# Patient Record
Sex: Male | Born: 1970 | Hispanic: Yes | Marital: Single | Smoking: Current some day smoker
Health system: Southern US, Community
[De-identification: ages and names within clinical notes are randomized; demographics above are authoritative.]

## PROBLEM LIST (undated history)

## (undated) DIAGNOSIS — I10 Essential (primary) hypertension: Secondary | ICD-10-CM

## (undated) DIAGNOSIS — L0291 Cutaneous abscess, unspecified: Secondary | ICD-10-CM

## (undated) DIAGNOSIS — E119 Type 2 diabetes mellitus without complications: Secondary | ICD-10-CM

---

## 2007-04-28 ENCOUNTER — Ambulatory Visit: Payer: Self-pay | Admitting: Gastroenterology

## 2009-02-11 ENCOUNTER — Emergency Department: Payer: Self-pay | Admitting: Emergency Medicine

## 2009-03-10 ENCOUNTER — Ambulatory Visit: Payer: Self-pay | Admitting: Family Medicine

## 2012-05-14 ENCOUNTER — Emergency Department: Payer: Self-pay | Admitting: Emergency Medicine

## 2014-04-01 ENCOUNTER — Ambulatory Visit
Admit: 2014-04-01 | Disposition: A | Discharge status: Home / Self Care | Payer: Self-pay | Attending: Adult Health | Admitting: Adult Health

## 2014-04-09 ENCOUNTER — Ambulatory Visit
Admit: 2014-04-09 | Disposition: A | Discharge status: Home / Self Care | Payer: Self-pay | Attending: Adult Health | Admitting: Adult Health

## 2017-06-10 ENCOUNTER — Other Ambulatory Visit (HOSPITAL_COMMUNITY): Payer: Self-pay | Admitting: Family Medicine

## 2017-06-10 ENCOUNTER — Ambulatory Visit
Admission: RE | Admit: 2017-06-10 | Discharge: 2017-06-10 | Disposition: A | Discharge status: Home / Self Care | Payer: Self-pay | Source: Ambulatory Visit | Attending: Family Medicine | Admitting: Family Medicine

## 2017-06-10 ENCOUNTER — Other Ambulatory Visit
Admission: RE | Admit: 2017-06-10 | Discharge: 2017-06-10 | Disposition: A | Discharge status: Home / Self Care | Payer: Self-pay | Source: Ambulatory Visit | Attending: Family Medicine | Admitting: Family Medicine

## 2017-06-10 DIAGNOSIS — R7611 Nonspecific reaction to tuberculin skin test without active tuberculosis: Secondary | ICD-10-CM | POA: Insufficient documentation

## 2018-02-18 ENCOUNTER — Emergency Department
Admission: EM | Admit: 2018-02-18 | Discharge: 2018-02-18 | Disposition: A | Discharge status: Home / Self Care | Payer: Self-pay | Attending: Emergency Medicine | Admitting: Emergency Medicine

## 2018-02-18 ENCOUNTER — Encounter: Payer: Self-pay | Admitting: Emergency Medicine

## 2018-02-18 ENCOUNTER — Emergency Department: Payer: Self-pay

## 2018-02-18 ENCOUNTER — Other Ambulatory Visit: Payer: Self-pay

## 2018-02-18 DIAGNOSIS — I10 Essential (primary) hypertension: Secondary | ICD-10-CM | POA: Insufficient documentation

## 2018-02-18 DIAGNOSIS — Z7984 Long term (current) use of oral hypoglycemic drugs: Secondary | ICD-10-CM | POA: Insufficient documentation

## 2018-02-18 DIAGNOSIS — E119 Type 2 diabetes mellitus without complications: Secondary | ICD-10-CM | POA: Insufficient documentation

## 2018-02-18 DIAGNOSIS — F17210 Nicotine dependence, cigarettes, uncomplicated: Secondary | ICD-10-CM | POA: Insufficient documentation

## 2018-02-18 DIAGNOSIS — M25461 Effusion, right knee: Secondary | ICD-10-CM | POA: Insufficient documentation

## 2018-02-18 HISTORY — DX: Type 2 diabetes mellitus without complications: E11.9

## 2018-02-18 HISTORY — DX: Essential (primary) hypertension: I10

## 2018-02-18 HISTORY — DX: Cutaneous abscess, unspecified: L02.91

## 2018-02-18 MED ORDER — MELOXICAM 15 MG PO TABS: 15.0000 mg | ORAL_TABLET | Freq: Every day | ORAL | 2 refills | Status: AC

## 2018-02-18 NOTE — ED Provider Notes (Signed)
[facility] Emergency Department Provider Note  ____________________________________________   First MD Initiated Contact with Patient 02/18/18 [phone]     (approximate)  I have reviewed the triage vital signs and the nursing notes.   HISTORY  Chief Complaint Knee Pain    HPI John Leonard is a 48 y.o. male [facility] emergency department complaining of right knee pain.  He states he has had pain for several weeks that has increased the last 3 days.  He denies any known injury.  Patient states that he has diabetes which is controlled with metformin.    Past Medical History:  Diagnosis Date  . Abscess   . Diabetes mellitus without complication (HCC)   . Hypertension     There are no active problems to display for this patient.   History reviewed. No pertinent surgical history.  Prior to Admission medications   Medication Sig Start Date End Date Taking? Authorizing Provider  metFORMIN (GLUCOPHAGE-XR) 750 MG 24 hr tablet Take 750 mg by mouth daily with breakfast.   Yes [provider]  meloxicam (MOBIC) 15 MG tablet Take 1 tablet (15 mg total) by mouth daily. 02/18/18 02/18/19  Faythe Ghee, PA-C    Allergies Patient has no known allergies.  No family history on file.  Social History Social History   Tobacco Use  . Smoking status: Current Some Day Smoker  . Smokeless tobacco: Never Used  Substance Use Topics  . Alcohol use: Yes  . Drug use: Not on file    Review of Systems  Constitutional: No fever/chills Eyes: No visual changes. ENT: No sore throat. Respiratory: Denies cough Genitourinary: Negative for dysuria. Musculoskeletal: Negative for back pain.  Positive for right knee pain Skin: Negative for rash.    ____________________________________________   PHYSICAL EXAM:  VITAL SIGNS: ED Triage Vitals  Enc Vitals Group     BP 02/18/18 0722 (!) 141/79     Pulse Rate 02/18/18 0722 73     Resp 02/18/18 0722 19    Temp 02/18/18 0722 98.7 F (37.1 C)     Temp Source 02/18/18 0722 Oral     SpO2 02/18/18 0722 98 %     Weight 02/18/18 0722 168 lb (76.2 kg)     Height 02/18/18 0722 5\' 6"  (1.676 m)     Head Circumference --      Peak Flow --      Pain Score 02/18/18 0725 8     Pain Loc --      Pain Edu? --      Excl. in GC? --     Constitutional: Alert and oriented. Well appearing and in no acute distress. Eyes: Conjunctivae are normal.  Head: Atraumatic. Nose: No congestion/rhinnorhea. Mouth/Throat: Mucous membranes are moist.   Neck:  supple no lymphadenopathy noted Cardiovascular: Normal rate, regular rhythm. Heart sounds are normal Respiratory: Normal respiratory effort.  No retractions, lungs c t a  GU: deferred Musculoskeletal: FROM all extremities, warm and well perfused, the right knee is tender at the joint line, no swelling is noted, no crepitus is noted, neurovascular is intact Neurologic:  Normal speech and language.  Skin:  Skin is warm, dry and intact. No rash noted. Psychiatric: Mood and affect are normal. Speech and behavior are normal.  ____________________________________________   LABS (all labs ordered are listed, but only abnormal results are displayed)  Labs Reviewed - No data to display ____________________________________________   ____________________________________________  RADIOLOGY  X-ray of the right knee shows a  very small joint effusion  ____________________________________________   PROCEDURES  Procedure(s) performed: Knee immobilizer  Procedures    ____________________________________________   INITIAL IMPRESSION / ASSESSMENT AND PLAN / ED COURSE  Pertinent labs & imaging results that were available during my care of the patient were reviewed by me and considered in my medical decision making (see chart for details).   Patient is 48 year old diabetic male presents emergency department complaint of right knee pain.  Physical exam shows  that the knee is tender in the joint line with minimal swelling  X-ray of the right knee shows a small joint effusion.  Explained the findings to the patient.  Is placed in a knee immobilizer.  Explained to him that would not want to drain this effusion at this time.  He wants to try conservative measures due to him being diabetic.  He can follow-up with orthopedics if not improving in 1 week and see if they will either draw the fluid off the knee or put a shot of cortisone in the knee.  States he understands and will comply.  Is given prescription meloxicam.  Is discharged stable condition.     As part of my medical decision making, I reviewed the following data within the electronic MEDICAL RECORD NUMBER Nursing notes reviewed and incorporated, Old chart reviewed, Radiograph reviewed x-ray of the right knee is negative, Notes from prior ED visits and [place] Controlled Substance Database  ____________________________________________   FINAL CLINICAL IMPRESSION(S) / ED DIAGNOSES  Final diagnoses:  Effusion of right knee      NEW MEDICATIONS STARTED DURING THIS VISIT:  New Prescriptions   MELOXICAM (MOBIC) 15 MG TABLET    Take 1 tablet (15 mg total) by mouth daily.     Note:  This document was prepared using Dragon voice recognition software and may include unintentional dictation errors.    Faythe GheeFisher, Susan W, PA-C 02/18/18 1045    Rockne MenghiniNorman, Anne-Caroline, MD 02/18/18 1248

## 2018-02-18 NOTE — Discharge Instructions (Addendum)
Follow-up with orthopedics.  Please call for appointment.  Apply ice to the right knee 3 times daily.  Take medication as prescribed.

## 2018-02-18 NOTE — ED Notes (Signed)
See triage note  Presents with right knee pain  States he has had pain to knee in past but over the past 3 days pain has increased  States he has been up on ladder a lot working   Ambulates with slight limp

## 2018-02-18 NOTE — ED Triage Notes (Signed)
Says right knee pain for quit a while, but for 3 days it has gotten worse.  He does not rmember injuring it.

## 2018-02-18 NOTE — ED Notes (Signed)
Pt was stable and ambulatory with a knee immobilizer in place at the time of discharge.

## 2018-03-30 ENCOUNTER — Emergency Department: Payer: Self-pay

## 2018-03-30 ENCOUNTER — Encounter: Payer: Self-pay | Admitting: Emergency Medicine

## 2018-03-30 ENCOUNTER — Other Ambulatory Visit: Payer: Self-pay

## 2018-03-30 ENCOUNTER — Emergency Department
Admission: EM | Admit: 2018-03-30 | Discharge: 2018-03-30 | Disposition: A | Discharge status: Home / Self Care | Payer: Self-pay | Attending: Emergency Medicine | Admitting: Emergency Medicine

## 2018-03-30 DIAGNOSIS — R05 Cough: Secondary | ICD-10-CM

## 2018-03-30 DIAGNOSIS — Z79899 Other long term (current) drug therapy: Secondary | ICD-10-CM | POA: Insufficient documentation

## 2018-03-30 DIAGNOSIS — I10 Essential (primary) hypertension: Secondary | ICD-10-CM | POA: Insufficient documentation

## 2018-03-30 DIAGNOSIS — R059 Cough, unspecified: Secondary | ICD-10-CM

## 2018-03-30 DIAGNOSIS — E119 Type 2 diabetes mellitus without complications: Secondary | ICD-10-CM | POA: Insufficient documentation

## 2018-03-30 DIAGNOSIS — R1013 Epigastric pain: Secondary | ICD-10-CM

## 2018-03-30 DIAGNOSIS — K2900 Acute gastritis without bleeding: Secondary | ICD-10-CM

## 2018-03-30 DIAGNOSIS — F17210 Nicotine dependence, cigarettes, uncomplicated: Secondary | ICD-10-CM | POA: Insufficient documentation

## 2018-03-30 LAB — LIPASE, BLOOD: Lipase: 39 U/L (ref 11–51)

## 2018-03-30 LAB — COMPREHENSIVE METABOLIC PANEL
ALT: 46 U/L — ABNORMAL HIGH (ref 0–44)
AST: 35 U/L (ref 15–41)
Albumin: 4.6 g/dL (ref 3.5–5.0)
Alkaline Phosphatase: 101 U/L (ref 38–126)
Anion gap: 8 (ref 5–15)
BILIRUBIN TOTAL: 0.6 mg/dL (ref 0.3–1.2)
BUN: 18 mg/dL (ref 6–20)
CO2: 26 mmol/L (ref 22–32)
CREATININE: 0.82 mg/dL (ref 0.61–1.24)
Calcium: 9 mg/dL (ref 8.9–10.3)
Chloride: 103 mmol/L (ref 98–111)
GFR calc non Af Amer: >60 mL/min (ref >60)
Glucose, Bld: 209 mg/dL — ABNORMAL HIGH (ref 70–99)
Potassium: 4.1 mmol/L (ref 3.5–5.1)
Sodium: 137 mmol/L (ref 135–145)
Total Protein: 7.8 g/dL (ref 6.5–8.1)

## 2018-03-30 LAB — CBC
HCT: 44.3 % (ref 39.0–52.0)
Hemoglobin: 14.8 g/dL (ref 13.0–17.0)
MCH: 30.3 pg (ref 26.0–34.0)
MCHC: 33.4 g/dL (ref 30.0–36.0)
MCV: 90.6 fL (ref 80.0–100.0)
PLATELETS: 167 10*3/uL (ref 150–400)
RBC: 4.89 MIL/uL (ref 4.22–5.81)
RDW: 12.5 % (ref 11.5–15.5)
WBC: 6.6 10*3/uL (ref 4.0–10.5)
nRBC: 0 % (ref 0.0–0.2)

## 2018-03-30 LAB — TROPONIN I: Troponin I: <0.03 ng/mL (ref <0.03)

## 2018-03-30 MED ORDER — SODIUM CHLORIDE 0.9% FLUSH: 3.0000 mL | Freq: Once | INTRAVENOUS | Status: DC

## 2018-03-30 MED ORDER — LIDOCAINE VISCOUS HCL 2 % MT SOLN
15.0000 mL | Freq: Once | OROMUCOSAL | Status: AC
  Administered 2018-03-30: 15 mL via ORAL
  Filled 2018-03-30: qty 15

## 2018-03-30 MED ORDER — ALUM & MAG HYDROXIDE-SIMETH 200-200-20 MG/5ML PO SUSP
30.0000 mL | Freq: Once | ORAL | Status: AC
  Administered 2018-03-30: 30 mL via ORAL
  Filled 2018-03-30: qty 30

## 2018-03-30 MED ORDER — FAMOTIDINE 40 MG PO TABS: 40.0000 mg | ORAL_TABLET | Freq: Every day | ORAL | 0 refills | Status: AC

## 2018-03-30 MED ORDER — FAMOTIDINE 20 MG PO TABS
20.0000 mg | ORAL_TABLET | Freq: Once | ORAL | Status: AC
  Administered 2018-03-30: 20 mg via ORAL
  Filled 2018-03-30: qty 1

## 2018-03-30 NOTE — ED Triage Notes (Signed)
Pt via pov from home with epigastric pain x 3 days as well as fever and cough. Pt ambulatory with no distress noted. Pt states he has had fever and runny nose with productive cough (yellow) x 3 days. Pt alert & oriented with NAD noted.

## 2018-03-30 NOTE — Discharge Instructions (Addendum)
Suspenda todos los NSAIDs, incluido el naproxeno, y tome 1000 mg de tylenol 3 veces al da segn sea necesario para [employer]. Tome pepcid una vez por noche durante las prximas 4 semanas para ayudar a que su estmago sane. Haga un seguimiento con su mdico en 2 das. Regrese a la sala de emergencias si tiene dolor abdominal nuevo o que empeora, si vomita sangre o est negro, si las heces son sanguinolentas o negras, falta de [place].   Stop all NSAIDs including naproxen and take tylenol 1000mg  3 times a day as needed for pain. Take pepcid once every night for the next 4 weeks to help your stomach heal. Follow up with your doctor in 2 days. Return to the ER for new or worsening abdominal pain, if you vomit blood or black, if your stool is bloody or black, shortness of breath, or fever.

## 2018-03-30 NOTE — ED Triage Notes (Signed)
FIRST NURSE NOTE-here for abdominal pain, reported fever, and cough. Ambulatory. NAD. Unlabored.

## 2018-03-30 NOTE — ED Provider Notes (Signed)
[facility] Emergency Department Provider Note  ____________________________________________  Time seen: Approximately 9:11 AM  I have reviewed the triage vital signs and the nursing notes.   HISTORY  Chief Complaint Abdominal Pain   HPI John Leonard is a 48 y.o. male with a history of diabetes and hypertension who presents for evaluation of cough, fever and abdominal pain.  Patient reports that his symptoms started 3 days ago.  Has had a dry cough with no chest pain or shortness of breath.  Has had subjective fevers at nighttime.  Last Motrin was yesterday evening.  No antipyretics this morning.  Patient also is complaining of intermittent sharp epigastric abdominal pain lasting seconds at a time.  No prior abdominal surgeries.  No vomiting or diarrhea. Patient has tried over-the-counter reflux medication this morning with no significant relief.  No known sick contact exposures.  No recent travel to endemic areas Covid 19. Patient reports taking a lot of NSAIDs recently for knee pain. No melena.  Past Medical History:  Diagnosis Date  . Abscess   . Diabetes mellitus without complication (HCC)   . Hypertension     Prior to Admission medications   Medication Sig Start Date End Date Taking? Authorizing Provider  famotidine (PEPCID) 40 MG tablet Take 1 tablet (40 mg total) by mouth at bedtime for 30 days. 03/30/18 04/29/18  Nita Sickle, MD  meloxicam (MOBIC) 15 MG tablet Take 1 tablet (15 mg total) by mouth daily. 02/18/18 02/18/19  Sherrie Mustache, Roselyn Bering, PA-C  metFORMIN (GLUCOPHAGE-XR) 750 MG 24 hr tablet Take 750 mg by mouth daily with breakfast.    [provider]    Allergies Patient has no known allergies.  History reviewed. No pertinent family history.  Social History Social History   Tobacco Use  . Smoking status: Current Some Day Smoker  . Smokeless tobacco: Never Used  Substance Use Topics  . Alcohol use: Yes    Comment:  sometimes  . Drug use: Not Currently    Review of Systems  Constitutional: + subjective fever. Eyes: Negative for visual changes. ENT: Negative for sore throat. Neck: No neck pain  Cardiovascular: Negative for chest pain. Respiratory: Negative for shortness of breath. + cough Gastrointestinal: Negative for abdominal pain, no vomiting or diarrhea. Genitourinary: Negative for dysuria. Musculoskeletal: Negative for back pain. Skin: Negative for rash. Neurological: Negative for headaches, weakness or numbness. Psych: No SI or HI  ____________________________________________   PHYSICAL EXAM:  VITAL SIGNS: ED Triage Vitals  Enc Vitals Group     BP 03/30/18 0850 (!) 155/91     Pulse Rate 03/30/18 0850 66     Resp 03/30/18 0850 18     Temp 03/30/18 0850 98.1 F (36.7 C)     Temp Source 03/30/18 0850 Oral     SpO2 03/30/18 0850 100 %     Weight 03/30/18 0852 170 lb (77.1 kg)     Height 03/30/18 0852 5\' 6"  (1.676 m)     Head Circumference --      Peak Flow --      Pain Score 03/30/18 0851 8     Pain Loc --      Pain Edu? --      Excl. in GC? --     Constitutional: Alert and oriented. Well appearing and in no apparent distress. HEENT:      Head: Normocephalic and atraumatic.         Eyes: Conjunctivae are normal. Sclera is non-icteric.  Mouth/Throat: Mucous membranes are moist.       Neck: Supple with no signs of meningismus. Cardiovascular: Regular rate and rhythm. No murmurs, gallops, or rubs. 2+ symmetrical distal pulses are present in all extremities. No JVD. Respiratory: Normal respiratory effort. Lungs are clear to auscultation bilaterally. No wheezes, crackles, or rhonchi.  Gastrointestinal: Soft, RUQ and epigastric tenderness, and non distended with positive bowel sounds. No rebound or guarding. Musculoskeletal: Nontender with normal range of motion in all extremities. No edema, cyanosis, or erythema of extremities. Neurologic: Normal speech and language. Face  is symmetric. Moving all extremities. No gross focal neurologic deficits are appreciated. Skin: Skin is warm, dry and intact. No rash noted. Psychiatric: Mood and affect are normal. Speech and behavior are normal.  ____________________________________________   LABS (all labs ordered are listed, but only abnormal results are displayed)  Labs Reviewed  COMPREHENSIVE METABOLIC PANEL - Abnormal; Notable for the following components:      Result Value   Glucose, Bld 209 (*)    ALT 46 (*)    All other components within normal limits  LIPASE, BLOOD  CBC  TROPONIN I  URINALYSIS, COMPLETE (UACMP) WITH MICROSCOPIC   ____________________________________________  EKG  ED ECG REPORT I, Nita Sickle, the attending physician, personally viewed and interpreted this ECG.  Normal sinus rhythm, rate of 71, normal intervals, normal axis, no ST elevations or depressions, T wave flattening in inferior and lateral leads.  Unchanged from prior from 2014 ____________________________________________  RADIOLOGY  I have personally reviewed the images performed during this visit and I agree with the Radiologist's read.   Interpretation by Radiologist:  Dg Chest Portable 1 View  Result Date: 03/30/2018 CLINICAL DATA:  Epigastric pain 3 days with fever and productive cough. EXAM: PORTABLE CHEST 1 VIEW COMPARISON:  06/10/2017 FINDINGS: Lungs are clear. Cardiomediastinal silhouette and remainder of the exam is unchanged. IMPRESSION: No active disease. Electronically Signed   By: Elberta Fortis M.D.   On: 03/30/2018 09:21   US Abdomen Limited Ruq  Result Date: 03/30/2018 CLINICAL DATA:  Epigastric pain 3 days.  Cough and fever. EXAM: ULTRASOUND ABDOMEN LIMITED RIGHT UPPER QUADRANT COMPARISON:  Abdominal ultrasound 03/10/2009. FINDINGS: Gallbladder: No gallstones or wall thickening visualized. No sonographic Murphy sign noted by sonographer. Common bile duct: Diameter: 1.6 mm. Liver: Increased  parenchymal echogenicity without focal mass. Portal vein is patent on color Doppler imaging with normal direction of blood flow towards the liver. IMPRESSION: No acute findings. Hepatic steatosis without focal mass. Electronically Signed   By: Elberta Fortis M.D.   On: 03/30/2018 11:10      ____________________________________________   PROCEDURES  Procedure(s) performed: None Procedures Critical Care performed:  None ____________________________________________   INITIAL IMPRESSION / ASSESSMENT AND PLAN / ED COURSE  48 y.o. male with a history of diabetes and hypertension who presents for evaluation of cough, fever and epigastric abdominal pain x 3 days.  Patient is well-appearing in no distress with normal vital signs, abdomen is soft with RUQ and epigastric tenderness with no rebound or guarding.  Differential diagnosis including GERD versus PUD versus pancreatitis versus gallbladder versus gastritis versus pneumonia versus flu versus viral illness.  Will give Pepcid and a GI cocktail.  Will get chest x-ray and labs.    _________________________ 11:31 AM on 03/30/2018 -----------------------------------------  Labs with no acute findings.  X-ray with no acute findings.  Right upper quadrant ultrasound showed no evidence of cholelithiasis or cholecystitis.  Chest x-ray negative for pneumonia.  Patient's presentation most likely  concerning for gastritis in the setting of heavy NSAID use recently plus a viral illness causing cough.  Remains afebrile in the emergency room.  Discussed return precautions.  Patient will be discharged home on Pepcid.  Recommended switching to Tylenol for aches and pains instead of NSAIDs.   As part of my medical decision making, I reviewed the following data within the electronic MEDICAL RECORD NUMBER Nursing notes reviewed and incorporated, Labs reviewed , EKG interpreted , Old EKG reviewed, Old chart reviewed, Radiograph reviewed , Notes from prior ED visits and [place]  Controlled Substance Database    Pertinent labs & imaging results that were available during my care of the patient were reviewed by me and considered in my medical decision making (see chart for details).    ____________________________________________   FINAL CLINICAL IMPRESSION(S) / ED DIAGNOSES  Final diagnoses:  Epigastric abdominal pain  Acute gastritis without hemorrhage, unspecified gastritis type  Cough      NEW MEDICATIONS STARTED DURING THIS VISIT:  ED Discharge Orders         Ordered    famotidine (PEPCID) 40 MG tablet  Daily at bedtime     03/30/18 1127           Note:  This document was prepared using Dragon voice recognition software and may include unintentional dictation errors.    Don Perking, [place], MD 03/30/18 [phone]

## 2019-07-18 ENCOUNTER — Ambulatory Visit: Payer: Self-pay | Attending: Internal Medicine

## 2019-07-18 DIAGNOSIS — Z23 Encounter for immunization: Secondary | ICD-10-CM

## 2019-07-18 NOTE — Progress Notes (Signed)
   Covid-19 Vaccination Clinic  Name:  Davian Hanshaw    MRN: [id number] DOB: [birth year]  07/18/2019  Mr. Kmari Halter was observed post Covid-19 immunization for 15 minutes without incident. He was provided with Vaccine Information Sheet and instruction to access the V-Safe system.   Mr. Shyhiem Beeney was instructed to call 911 with any severe reactions post vaccine: Marland Kitchen Difficulty breathing  . Swelling of face and throat  . A fast heartbeat  . A bad rash all over body  . Dizziness and weakness   Immunizations Administered    Name Date Dose VIS Date Route   Pfizer COVID-19 Vaccine 07/18/2019 11:03 AM 0.3 mL 03/04/2018 Intramuscular   Manufacturer: [employer], [employer]   Lot: [id number]   NDC: [address]-6

## 2019-11-30 ENCOUNTER — Emergency Department (HOSPITAL_COMMUNITY)
Admission: EM | Admit: 2019-11-30 | Discharge: 2019-12-01 | Disposition: A | Discharge status: Home / Self Care | Payer: No Typology Code available for payment source | Attending: Emergency Medicine | Admitting: Emergency Medicine

## 2019-11-30 ENCOUNTER — Emergency Department (HOSPITAL_COMMUNITY): Payer: No Typology Code available for payment source

## 2019-11-30 ENCOUNTER — Encounter (HOSPITAL_COMMUNITY): Payer: Self-pay | Admitting: Pediatrics

## 2019-11-30 ENCOUNTER — Other Ambulatory Visit: Payer: Self-pay

## 2019-11-30 DIAGNOSIS — S6721XA Crushing injury of right hand, initial encounter: Secondary | ICD-10-CM | POA: Diagnosis not present

## 2019-11-30 DIAGNOSIS — E119 Type 2 diabetes mellitus without complications: Secondary | ICD-10-CM | POA: Insufficient documentation

## 2019-11-30 DIAGNOSIS — I10 Essential (primary) hypertension: Secondary | ICD-10-CM | POA: Insufficient documentation

## 2019-11-30 DIAGNOSIS — Z7984 Long term (current) use of oral hypoglycemic drugs: Secondary | ICD-10-CM | POA: Insufficient documentation
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
## 2019-12-01 MED ORDER — CYCLOBENZAPRINE HCL 10 MG PO TABS: 10.0000 mg | ORAL_TABLET | Freq: Two times a day (BID) | ORAL | 0 refills | Status: DC | PRN

## 2019-12-01 MED ORDER — OXYCODONE-ACETAMINOPHEN 5-325 MG PO TABS
2.0000 | ORAL_TABLET | Freq: Once | ORAL | Status: AC
  Administered 2019-12-01: 2 via ORAL
  Filled 2019-12-01: qty 2

## 2019-12-01 MED ORDER — KETOROLAC TROMETHAMINE 60 MG/2ML IM SOLN
30.0000 mg | Freq: Once | INTRAMUSCULAR | Status: AC
  Administered 2019-12-01: 30 mg via INTRAMUSCULAR
  Filled 2019-12-01: qty 2

## 2019-12-01 MED ORDER — MELOXICAM 7.5 MG PO TABS: 7.5000 mg | ORAL_TABLET | Freq: Every day | ORAL | 0 refills | Status: AC
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
## 2019-12-01 NOTE — ED Notes (Signed)
Pt refused DG Shoulder Right. EDP made aware.

## 2019-12-01 NOTE — ED Notes (Signed)
Patient verbalizes understanding of discharge instructions. Opportunity for questioning and answers were provided. Armband removed by staff, pt discharged from ED ambulatory to home.  
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
## 2022-05-26 ENCOUNTER — Emergency Department: Payer: Self-pay

## 2022-05-26 ENCOUNTER — Emergency Department
Admission: EM | Admit: 2022-05-26 | Discharge: 2022-05-26 | Disposition: A | Discharge status: Home / Self Care | Payer: Self-pay | Attending: Emergency Medicine | Admitting: Emergency Medicine

## 2022-05-26 ENCOUNTER — Other Ambulatory Visit: Payer: Self-pay

## 2022-05-26 ENCOUNTER — Encounter: Payer: Self-pay | Admitting: Emergency Medicine

## 2022-05-26 DIAGNOSIS — Y990 Civilian activity done for income or pay: Secondary | ICD-10-CM | POA: Insufficient documentation

## 2022-05-26 DIAGNOSIS — S29019A Strain of muscle and tendon of unspecified wall of thorax, initial encounter: Secondary | ICD-10-CM

## 2022-05-26 DIAGNOSIS — I10 Essential (primary) hypertension: Secondary | ICD-10-CM | POA: Diagnosis not present

## 2022-05-26 DIAGNOSIS — E119 Type 2 diabetes mellitus without complications: Secondary | ICD-10-CM | POA: Diagnosis not present

## 2022-05-26 DIAGNOSIS — M546 Pain in thoracic spine: Secondary | ICD-10-CM | POA: Diagnosis present

## 2022-05-26 DIAGNOSIS — X58XXXA Exposure to other specified factors, initial encounter: Secondary | ICD-10-CM | POA: Insufficient documentation

## 2022-05-26 DIAGNOSIS — S29012A Strain of muscle and tendon of back wall of thorax, initial encounter: Secondary | ICD-10-CM | POA: Insufficient documentation

## 2022-05-26 DIAGNOSIS — Z79899 Other long term (current) drug therapy: Secondary | ICD-10-CM | POA: Diagnosis not present

## 2022-05-26 LAB — BASIC METABOLIC PANEL
Anion gap: 9 (ref 5–15)
BUN: 10 mg/dL (ref 6–20)
CO2: 25 mmol/L (ref 22–32)
Calcium: 9.3 mg/dL (ref 8.9–10.3)
Chloride: 101 mmol/L (ref 98–111)
Creatinine, Ser: 0.67 mg/dL (ref 0.61–1.24)
GFR, Estimated: >60 mL/min (ref >60)
Glucose, Bld: 363 mg/dL — ABNORMAL HIGH (ref 70–99)
Potassium: 3.8 mmol/L (ref 3.5–5.1)
Sodium: 135 mmol/L (ref 135–145)

## 2022-05-26 LAB — CBC
HCT: 47.9 % (ref 39.0–52.0)
Hemoglobin: 16.7 g/dL (ref 13.0–17.0)
MCH: 29.3 pg (ref 26.0–34.0)
MCHC: 34.9 g/dL (ref 30.0–36.0)
MCV: 84 fL (ref 80.0–100.0)
Platelets: 211 10*3/uL (ref 150–400)
RBC: 5.7 MIL/uL (ref 4.22–5.81)
RDW: 11.8 % (ref 11.5–15.5)
WBC: 5.8 10*3/uL (ref 4.0–10.5)
nRBC: 0 % (ref 0.0–0.2)

## 2022-05-26 LAB — CK: Total CK: 94 U/L (ref 49–397)

## 2022-05-26 MED ORDER — IOHEXOL 350 MG/ML SOLN
80.0000 mL | Freq: Once | INTRAVENOUS | Status: AC | PRN
  Administered 2022-05-26: 80 mL via INTRAVENOUS

## 2022-05-26 MED ORDER — CYCLOBENZAPRINE HCL 7.5 MG PO TABS: 7.5000 mg | ORAL_TABLET | Freq: Every evening | ORAL | 0 refills | Status: AC | PRN

## 2022-05-26 MED ORDER — KETOROLAC TROMETHAMINE 15 MG/ML IJ SOLN
15.0000 mg | Freq: Once | INTRAMUSCULAR | Status: AC
  Administered 2022-05-26: 15 mg via INTRAVENOUS
  Filled 2022-05-26: qty 1

## 2022-05-26 MED ORDER — AMLODIPINE BESYLATE 2.5 MG PO TABS: 2.5000 mg | ORAL_TABLET | Freq: Every day | ORAL | 2 refills | Status: AC

## 2022-05-26 MED ORDER — IOHEXOL 300 MG/ML  SOLN: 100.0000 mL | Freq: Once | INTRAMUSCULAR | Status: DC | PRN

## 2022-05-26 NOTE — ED Provider Notes (Signed)
[facility] Provider Note    Event Date/Time   First MD Initiated Contact with Patient 05/26/22 [phone]     (approximate)   History   Back Pain   Spanish video interpreter utilized throughout  HPI  John Leonard is a 52 y.o. male has a history of diabetes and hypertension  He works in [employer].  He does not recall any 1 specific injury.  But about 8 days ago he started having a fairly severe pain in his upper back sore between his shoulder blades and a little bit more in the right upper back area.  He reports severe and has become worse.  There is no rashes no swelling no numbness or weakness with it no chest pain.  However does report a severe pain in his upper back.  It is worsened by movement and use of the right arm and right shoulder, but he denies any injury.  He is still able to use the arm and shoulder without difficulty but has lots of pain along the upper mid back  Reports he takes medication for diabetes, but does not take any medicine for high blood pressure.  He does have high blood pressure documented in his clinical history, but reports to myself and interpreter that he is diabetic and does not have issue with high blood pressure     Physical Exam   Triage Vital Signs: ED Triage Vitals  Enc Vitals Group     BP 05/26/22 0733 (!) 176/94     Pulse Rate 05/26/22 0733 73     Resp 05/26/22 0733 18     Temp 05/26/22 0733 98.6 F (37 C)     Temp Source 05/26/22 0733 Oral     SpO2 05/26/22 0733 97 %     Weight 05/26/22 0737 160 lb (72.6 kg)     Height 05/26/22 0737 5\' 6"  (1.676 m)     Head Circumference --      Peak Flow --      Pain Score --      Pain Loc --      Pain Edu? --      Excl. in GC? --     Most recent vital signs: Vitals:   05/26/22 0733  BP: (!) 176/94  Pulse: 73  Resp: 18  Temp: 98.6 F (37 C)  SpO2: 97%     General: Awake, sitting up, appears in pain, reports he cannot seem to find a position that helps  his pain. CV:  Good peripheral perfusion.  Normal tones and rate Resp:  Normal effort.  Clear bilateral, normal work of breathing Abd:  No distention.  Soft nontender nondistended Other:    RIGHT Right upper extremity demonstrates normal strength, good use of all muscles. No edema bruising or contusions of the right shoulder/upper arm, right elbow, right forearm / hand. Full range of motion of the right right upper extremity without pain. No evidence of trauma. Strong radial pulse. Intact median/ulnar/radial neuro-muscular exam.  LEFT Left upper extremity demonstrates normal strength, good use of all muscles. No edema bruising or contusions of the left shoulder/upper arm, left elbow, left forearm / hand. Full range of motion of the left  upper extremity without pain. No evidence of trauma. Strong radial pulse. Intact median/ulnar/radial neuro-muscular exam.  Patient does report tenderness along the right paraspinous thoracic and right trapezius.  However, it is somewhat hard to reproduce his pain he reports it feels like a deep-seated muscle pain, it  is worsened somewhat by use of the right arm   ED Results / Procedures / Treatments   Labs (all labs ordered are listed, but only abnormal results are displayed) Labs Reviewed  BASIC METABOLIC PANEL - Abnormal; Notable for the following components:      Result Value   Glucose, Bld 363 (*)    All other components within normal limits  CBC  CK     EKG  Interpreted by me at 845 heart rate 70 QRS 90 QTc 440 Normal sinus rhythm no evidence of ischemia   RADIOLOGY  CT Angio Chest Aorta W and/or Wo Contrast  Result Date: 05/26/2022 CLINICAL DATA:  Acute aortic syndrome (AAS) suspected upper to R mid back pain, hypertension, exclude dissection EXAM: CT ANGIOGRAPHY CHEST WITH CONTRAST TECHNIQUE: Multidetector CT imaging of the chest was performed using the standard protocol during bolus administration of intravenous contrast. Multiplanar CT  image reconstructions and MIPs were obtained to evaluate the vascular anatomy. RADIATION DOSE REDUCTION: This exam was performed according to the departmental dose-optimization program which includes automated exposure control, adjustment of the mA and/or kV according to patient size and/or use of iterative reconstruction technique. CONTRAST:  80mL OMNIPAQUE IOHEXOL 350 MG/ML SOLN COMPARISON:  None Available. FINDINGS: Cardiovascular: SVC patent. Heart size normal. No pericardial effusion. Satisfactory opacification of pulmonary arteries noted, and there is no evidence of pulmonary emboli. Adequate contrast opacification of the thoracic aorta with no evidence of dissection, aneurysm, or stenosis. There is classic 3-vessel brachiocephalic arch anatomy without proximal stenosis. No significant atheromatous change. Mediastinum/Nodes: No mediastinal hematoma, mass, or adenopathy. Lungs/Pleura: No pleural effusion. No pneumothorax. Minimal dependent atelectasis at the lung bases. Upper Abdomen: No acute findings. Musculoskeletal: Vertebral endplate spurring at multiple levels in the mid and lower thoracic spine. No acute findings. Review of the MIP images confirms the above findings. IMPRESSION: Negative for acute PE or thoracic aortic dissection. Electronically Signed   By: Corlis Leak M.D.   On: 05/26/2022 09:38    I personally interpreted the patient's thoracic x-ray, no acute bony abnormality noted   PROCEDURES:  Critical Care performed: No  Procedures   MEDICATIONS ORDERED IN ED: Medications  ketorolac (TORADOL) 15 MG/ML injection 15 mg (15 mg Intravenous Given 05/26/22 0840)  iohexol (OMNIPAQUE) 350 MG/ML injection 80 mL (80 mLs Intravenous Contrast Given 05/26/22 0925)     IMPRESSION / MDM / ASSESSMENT AND PLAN / ED COURSE  I reviewed the triage vital signs and the nursing notes.                              Differential diagnosis includes, but is not limited to, musculoskeletal causation,  muscle strain, muscle spasm, tendinitis, overuse injury.  However also of concern after discussing with the patient is reports this pain has been severe for about 8 days he does have significant hypertension in the emergency department, and difficult to exclude intrathoracic cause such as dissection given this clinical context.  I feel it is low likelihood to represent dissection, but given the nature of his pain discussed with the patient and he is amenable to proceed with obtaining labs and CT imaging to further evaluate for potential referred pain from intrathoracic cause.  No associated symptoms of ACS.  He is nontoxic well-appearing.  Amenable and agreeable to receiving Toradol  Patient's presentation is most consistent with acute complicated illness / injury requiring diagnostic workup.   ----------------------------------------- 10:07 AM on 05/26/2022 -----------------------------------------  Reviewed workup and recommended treatment with the patient via interpreter.  He is understanding very agreeable.  Informed him not to drive within 8 hours of using cyclobenzaprine.  He will utilize it prior to going to bed and not while driving or working.  Return precautions and treatment recommendations and follow-up discussed with the patient who is agreeable with the plan.  Discussed blood pressure with him.  He is not currently taking a blood pressure medication.  Will start him on a low-dose of amlodipine for uncontrolled hypertension.  I did check his blood pressure a second time after he received the Toradol and even though his pain was improved, he still has moderate to severe hypertension.  He has no signs or symptoms of hypertensive emergency at this point.  Will start on amlodipine and he is agreeable to following up in about 1 week's time with his primary care doctor      FINAL CLINICAL IMPRESSION(S) / ED DIAGNOSES   Final diagnoses:  Thoracic myofascial strain, initial encounter   Uncontrolled hypertension     Rx / DC Orders   ED Discharge Orders          Ordered    cyclobenzaprine (FEXMID) 7.5 MG tablet  At bedtime PRN        05/26/22 1006    amLODipine (NORVASC) 2.5 MG tablet  Daily        05/26/22 1006             Note:  This document was prepared using Dragon voice recognition software and may include unintentional dictation errors.   Sharyn Creamer, MD 05/26/22 1008

## 2022-05-26 NOTE — ED Triage Notes (Signed)
Pt via POV from home. Pt c/o upper back pain that radiates to the R shoulder. Denies injury. Does work [employer]. Pt tried OTC medication with no relief. Denies chest pain, SOB. Pt is A&Ox4 and NAD

## 2022-05-26 NOTE — ED Provider Triage Note (Signed)
Emergency Medicine Provider Triage Evaluation Note  Cobin Hirtz , a 52 y.o. male  was evaluated in triage.  Pt complains of upper back pain, no known injury, pain reproduced with movement, taking otc meds without relief, sx for 8 days, does work [employer].  Review of Systems  Positive:  Negative:   Physical Exam  BP (!) 176/94 (BP Location: Left Arm)   Pulse 73   Temp 98.6 F (37 C) (Oral)   Resp 18   SpO2 97%  Gen:   Awake, no distress   Resp:  Normal effort  MSK:   Moves extremities without difficulty , upper tspine tender to palp Other:    Medical Decision Making  Medically screening exam initiated at 7:37 AM.  Appropriate orders placed.  Harel Alee Liva was informed that the remainder of the evaluation will be completed by another provider, this initial triage assessment does not replace that evaluation, and the importance of remaining in the ED until their evaluation is complete.  Xray tspine   Faythe Ghee, PA-C 05/26/22 [id number]

## 2022-10-03 DIAGNOSIS — H2513 Age-related nuclear cataract, bilateral: Secondary | ICD-10-CM | POA: Diagnosis not present

## 2022-10-03 DIAGNOSIS — E119 Type 2 diabetes mellitus without complications: Secondary | ICD-10-CM | POA: Diagnosis not present

## 2022-10-03 DIAGNOSIS — H40003 Preglaucoma, unspecified, bilateral: Secondary | ICD-10-CM | POA: Diagnosis not present

## 2022-10-09 DIAGNOSIS — G8929 Other chronic pain: Secondary | ICD-10-CM | POA: Diagnosis not present

## 2022-10-09 DIAGNOSIS — M62838 Other muscle spasm: Secondary | ICD-10-CM | POA: Diagnosis not present

## 2022-10-09 DIAGNOSIS — M7541 Impingement syndrome of right shoulder: Secondary | ICD-10-CM | POA: Diagnosis not present

## 2022-10-09 DIAGNOSIS — M778 Other enthesopathies, not elsewhere classified: Secondary | ICD-10-CM | POA: Diagnosis not present

## 2022-10-09 DIAGNOSIS — M25511 Pain in right shoulder: Secondary | ICD-10-CM | POA: Diagnosis not present

## 2022-11-05 DIAGNOSIS — L821 Other seborrheic keratosis: Secondary | ICD-10-CM | POA: Diagnosis not present

## 2022-11-05 DIAGNOSIS — D2362 Other benign neoplasm of skin of left upper limb, including shoulder: Secondary | ICD-10-CM | POA: Diagnosis not present

## 2022-11-05 DIAGNOSIS — D485 Neoplasm of uncertain behavior of skin: Secondary | ICD-10-CM | POA: Diagnosis not present

## 2022-11-05 DIAGNOSIS — R52 Pain, unspecified: Secondary | ICD-10-CM | POA: Diagnosis not present
